# Patient Record
Sex: Female | Born: 1945
Health system: Southern US, Community
[De-identification: ages and names within clinical notes are randomized; demographics above are authoritative.]

## PROBLEM LIST (undated history)

## (undated) DIAGNOSIS — I10 Essential (primary) hypertension: Secondary | ICD-10-CM

## (undated) DIAGNOSIS — E119 Type 2 diabetes mellitus without complications: Secondary | ICD-10-CM

## (undated) DIAGNOSIS — Z8619 Personal history of other infectious and parasitic diseases: Secondary | ICD-10-CM

## (undated) HISTORY — PX: TUBAL LIGATION: SHX77

## (undated) HISTORY — PX: HEMORRHOID SURGERY: SHX153

## (undated) HISTORY — PX: CATARACT EXTRACTION, BILATERAL: SHX1313

## (undated) HISTORY — DX: Personal history of other infectious and parasitic diseases: Z86.19

---

## 2007-08-26 DIAGNOSIS — Z8619 Personal history of other infectious and parasitic diseases: Secondary | ICD-10-CM

## 2007-08-26 HISTORY — DX: Personal history of other infectious and parasitic diseases: Z86.19

## 2011-10-23 ENCOUNTER — Emergency Department (INDEPENDENT_AMBULATORY_CARE_PROVIDER_SITE_OTHER): Payer: 59

## 2011-10-23 ENCOUNTER — Emergency Department (HOSPITAL_BASED_OUTPATIENT_CLINIC_OR_DEPARTMENT_OTHER)
Admission: EM | Admit: 2011-10-23 | Discharge: 2011-10-23 | Disposition: A | Discharge status: Home / Self Care | Payer: 59 | Attending: Emergency Medicine | Admitting: Emergency Medicine

## 2011-10-23 ENCOUNTER — Encounter (HOSPITAL_BASED_OUTPATIENT_CLINIC_OR_DEPARTMENT_OTHER): Payer: Self-pay | Admitting: *Deleted

## 2011-10-23 DIAGNOSIS — I10 Essential (primary) hypertension: Secondary | ICD-10-CM | POA: Insufficient documentation

## 2011-10-23 DIAGNOSIS — M25569 Pain in unspecified knee: Secondary | ICD-10-CM

## 2011-10-23 DIAGNOSIS — R011 Cardiac murmur, unspecified: Secondary | ICD-10-CM | POA: Insufficient documentation

## 2011-10-23 DIAGNOSIS — M25561 Pain in right knee: Secondary | ICD-10-CM

## 2011-10-23 HISTORY — DX: Essential (primary) hypertension: I10

## 2011-10-23 MED ORDER — MELOXICAM 15 MG PO TABS: 15.0000 mg | ORAL_TABLET | Freq: Every day | ORAL | Status: DC

## 2011-10-23 NOTE — ED Notes (Signed)
Patient states she has had low back pain approximately 2 months ago.  Pain in her low back has resolved and now has progressively painful bilateral lower legs.  Pain is worse in the right leg and knee.  No known injury to her back.
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
## 2011-10-23 NOTE — Discharge Instructions (Signed)
I am giving you a prescription for Mobic.  It is a once per day medicine like Motrin.  Do not take both at the same time. I want you to take 1000mg  of Tylenol three times per day. You need to get a regular physician to see. I am giving you the number for [facility].  If you can't get in to see a regular physician in the next month or so, get in touch with them about your murmur.  RESOURCE GUIDE  Dental Problems  Patients with Medicaid: [facility] [phone] [address]                                           [phone] [address] Phone:  [phone]                                                  Phone:  [phone]  If unable to pay or uninsured, contact:  [facility] or [facility]. to become qualified for the adult dental clinic.  Chronic Pain Problems Contact [facility]  [phone] Patients need to be referred by their primary care doctor.  Insufficient Money for Medicine Contact [employer]:  call "211" or [facility] [phone].  No Primary Care Doctor Call [facility]  4167921826 Other agencies that provide inexpensive medical care    [facility]  [phone]    [facility]  [phone]    [facility]  [phone]    [facility]  [phone]    [facility]  [phone]    [facility]  [phone]  Psychological Services [facility] Behavioral Health  [phone] [facility]  [phone] [facility] Mental Health   [phone] (emergency services [phone])  Substance Abuse Resources [facility] and Drug Services  [phone] [facility] [phone] [facility] [place] [phone] Floydene Flock [phone] Residential & Outpatient Substance Abuse Program  7023586154  Abuse/Neglect [facility] Child Abuse Hotline 331-065-9075 [facility] Child Abuse Hotline 734-193-9338 (After Hours)  Emergency  Shelter [facility] 8597993062  Maternity Homes Room at the [place] [facility] [place] [phone] [facility] Services [phone]  MRSA Hotline #:   (478)458-5911    [facility] Resources  [facility]     [employer]                          [facility] Dept. [address] [place]                       [address]      [address]  Patrecia Pace  [facility] Phone:  [phone]                                   Phone:  [phone]                 Phone:  [place] Phone:  [place] [phone] (After Hours)

## 2011-10-23 NOTE — ED Provider Notes (Signed)
I saw and evaluated the patient, reviewed the resident's note and I agree with the findings and plan. Patient with knee pain consistent with osteoarthritis. She has no signs of effusion or septic joint on exam. Complex symptoms have been slowly worsening over the last several months. She denies any,. Plain film shows osseous demineralization. Also incidentally patient was found to have a heart murmur today. Patient is asymptomatic from this. She was given followup to cardiology, PCP, ortho  Gwyneth Sprout, MD 10/23/11 1220

## 2011-10-24 ENCOUNTER — Encounter: Payer: Self-pay | Admitting: Internal Medicine

## 2011-10-24 ENCOUNTER — Ambulatory Visit (INDEPENDENT_AMBULATORY_CARE_PROVIDER_SITE_OTHER): Payer: 59 | Admitting: Internal Medicine

## 2011-10-24 ENCOUNTER — Telehealth: Payer: Self-pay | Admitting: *Deleted

## 2011-10-24 DIAGNOSIS — I10 Essential (primary) hypertension: Secondary | ICD-10-CM

## 2011-10-24 DIAGNOSIS — R011 Cardiac murmur, unspecified: Secondary | ICD-10-CM

## 2011-10-24 DIAGNOSIS — Z79899 Other long term (current) drug therapy: Secondary | ICD-10-CM

## 2011-10-24 DIAGNOSIS — M25569 Pain in unspecified knee: Secondary | ICD-10-CM

## 2011-10-24 LAB — CBC WITH DIFFERENTIAL/PLATELET
Hemoglobin: 12.9 g/dL (ref 12.0–15.0)
Lymphocytes Relative: 42 % (ref 12–46)
Lymphs Abs: 2.9 10*3/uL (ref 0.7–4.0)
MCV: 82.2 fL (ref 78.0–100.0)
Monocytes Relative: 8 % (ref 3–12)
Neutrophils Relative %: 49 % (ref 43–77)
Platelets: 306 10*3/uL (ref 150–400)
RBC: 4.89 MIL/uL (ref 3.87–5.11)
WBC: 6.9 10*3/uL (ref 4.0–10.5)

## 2011-10-24 LAB — BASIC METABOLIC PANEL
CO2: 28 mEq/L (ref 19–32)
Chloride: 105 mEq/L (ref 96–112)
Potassium: 4.1 mEq/L (ref 3.5–5.3)
Sodium: 141 mEq/L (ref 135–145)

## 2011-10-24 MED ORDER — HYDROCHLOROTHIAZIDE 25 MG PO TABS: 25.0000 mg | ORAL_TABLET | Freq: Every day | ORAL | Status: DC

## 2011-10-24 NOTE — Patient Instructions (Signed)
We are in the process of scheduling your echocardiogram for evaluation of murmur

## 2011-10-24 NOTE — Telephone Encounter (Signed)
Patient called and left voice message stating she was seen today and [facility] she would like to change pharmacies to [facility] [address] [facility] [place] [facility].  Rx for HCTZ sent to [facility] [address]. Patient is aware.
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
## 2011-10-30 ENCOUNTER — Telehealth: Payer: Self-pay | Admitting: Internal Medicine

## 2011-10-30 NOTE — Telephone Encounter (Signed)
Received medical records from [facility]-Dr. Carlis Abbott

## 2011-11-01 ENCOUNTER — Encounter: Payer: Self-pay | Admitting: Internal Medicine

## 2011-11-01 DIAGNOSIS — I10 Essential (primary) hypertension: Secondary | ICD-10-CM | POA: Insufficient documentation

## 2011-11-01 DIAGNOSIS — M25569 Pain in unspecified knee: Secondary | ICD-10-CM | POA: Insufficient documentation

## 2011-11-01 DIAGNOSIS — R011 Cardiac murmur, unspecified: Secondary | ICD-10-CM | POA: Insufficient documentation

## 2011-11-01 NOTE — Assessment & Plan Note (Signed)
Asx. Obtain cbc, chem7, tsh. Begin hctz. Monitor bp as outpt and f/u in clinic as scheduled.

## 2011-11-01 NOTE — Assessment & Plan Note (Signed)
Asx. Schedule echo

## 2011-11-01 NOTE — Assessment & Plan Note (Signed)
Attempt mobic prn with food and no other nsaids. Consider ortho referral if sx's persist

## 2011-11-06 ENCOUNTER — Other Ambulatory Visit: Payer: Self-pay

## 2011-11-06 ENCOUNTER — Ambulatory Visit (HOSPITAL_COMMUNITY): Payer: 59 | Attending: Cardiology

## 2011-11-06 DIAGNOSIS — R011 Cardiac murmur, unspecified: Secondary | ICD-10-CM | POA: Insufficient documentation

## 2011-11-06 DIAGNOSIS — I10 Essential (primary) hypertension: Secondary | ICD-10-CM | POA: Insufficient documentation

## 2011-11-06 DIAGNOSIS — I059 Rheumatic mitral valve disease, unspecified: Secondary | ICD-10-CM | POA: Insufficient documentation

## 2011-11-06 DIAGNOSIS — I079 Rheumatic tricuspid valve disease, unspecified: Secondary | ICD-10-CM | POA: Insufficient documentation

## 2011-11-14 ENCOUNTER — Encounter: Payer: Self-pay | Admitting: Internal Medicine

## 2011-11-14 ENCOUNTER — Telehealth: Payer: Self-pay | Admitting: Internal Medicine

## 2011-11-14 ENCOUNTER — Ambulatory Visit (INDEPENDENT_AMBULATORY_CARE_PROVIDER_SITE_OTHER): Payer: 59 | Admitting: Internal Medicine

## 2011-11-14 VITALS — BP 142/90 | HR 92 | Temp 98.3°F | Resp 18 | Ht 65.0 in | Wt 186.0 lb

## 2011-11-14 DIAGNOSIS — R011 Cardiac murmur, unspecified: Secondary | ICD-10-CM

## 2011-11-14 DIAGNOSIS — I10 Essential (primary) hypertension: Secondary | ICD-10-CM

## 2011-11-14 DIAGNOSIS — M25569 Pain in unspecified knee: Secondary | ICD-10-CM

## 2011-11-14 DIAGNOSIS — Z79899 Other long term (current) drug therapy: Secondary | ICD-10-CM

## 2011-11-14 NOTE — Patient Instructions (Addendum)
Please schedule chem7-v58.69 and fasting lipid-401.9 prior to next visit

## 2011-11-14 NOTE — Telephone Encounter (Signed)
Please schedule chem7-v58.69 and fasting lipid-401.9 prior to next visit   Patient is coming in for follow up on 02/13/12.

## 2011-11-14 NOTE — Telephone Encounter (Signed)
Lab orders entered for June 2013. 

## 2011-11-16 NOTE — Assessment & Plan Note (Signed)
Improved

## 2011-11-16 NOTE — Assessment & Plan Note (Signed)
Echo shows mild concentric hypertrophy without focal abn. Suggestion of dynamic obstruction with mildly increased lvot. asx and no definitive criteria for hcm. Discussed with pt findings and option of cardiology evaluation vs observation with repeat echo. Wishes for the later.

## 2011-11-16 NOTE — Assessment & Plan Note (Signed)
Improving control. Continue current regimen. Obtain chem7 and lipid prior to next visit. Low sodium diet, exercise and wt loss.

## 2011-11-16 NOTE — Progress Notes (Signed)
  Subjective:    Patient ID: Kelly Griffin, female    DOB: [birth year], 66 y.o.   MRN: [id number]  HPI Pt presents to clinic for followup of multiple medical problems. BP now improving. Tolerating hctz without adverse effect. Right knee pain improved with only mild intermittent discomfort. Did not have to take the mobic. Reviewed glu 131 but was random. Reviewed echocardiogram obtained due to murmur. Denies cp, dyspnea, palpitations, dizziness, syncope or family h/o sudden death.   Past Medical History  Diagnosis Date  . Hypertension   . History of chicken pox     childhood  . History of shingles 2009   Past Surgical History  Procedure Date  . Tubal ligation   . Hemorrhoid surgery   . Cataract extraction, bilateral     reports that she has never smoked. She has never used smokeless tobacco. She reports that she does not drink alcohol or use illicit drugs. family history includes Heart disease in her maternal grandmother and Hypertension in her mother.  There is no history of Prostate cancer, and Breast cancer, and Colon cancer, and Diabetes, and Hyperlipidemia, . No Known Allergies    Review of Systems see hpi     Objective:   Physical Exam  Physical Exam  Nursing note and vitals reviewed. Constitutional: Appears well-developed and well-nourished. No distress.  HENT:  Head: Normocephalic and atraumatic.  Right Ear: External ear normal.  Left Ear: External ear normal.  Eyes: Conjunctivae are normal. No scleral icterus.  Neck: Neck supple. Carotid bruit is not present.  Cardiovascular: Normal rate, regular rhythm and normal heart sounds.  Exam reveals no gallop and no friction rub.   3/6 systolic murmur. Pulmonary/Chest: Effort normal and breath sounds normal. No respiratory distress. He has no wheezes. no rales.  Lymphadenopathy:    He has no cervical adenopathy.  Neurological:Alert.  Skin: Skin is warm and dry. Not diaphoretic.  Psychiatric: Has a normal mood and affect.          Assessment & Plan:

## 2012-02-13 ENCOUNTER — Ambulatory Visit: Payer: 59 | Admitting: Internal Medicine

## 2012-05-18 ENCOUNTER — Other Ambulatory Visit: Payer: Self-pay | Admitting: *Deleted

## 2012-05-18 MED ORDER — HYDROCHLOROTHIAZIDE 25 MG PO TABS: 25.0000 mg | ORAL_TABLET | Freq: Every day | ORAL | Status: DC

## 2012-05-18 NOTE — Telephone Encounter (Signed)
Rx to pharmacy--*PATIENT DUE FOR OFFICE VISIT*/SLS

## 2012-06-22 ENCOUNTER — Encounter: Payer: Self-pay | Admitting: Internal Medicine

## 2012-06-22 ENCOUNTER — Ambulatory Visit (INDEPENDENT_AMBULATORY_CARE_PROVIDER_SITE_OTHER): Payer: Self-pay | Admitting: Internal Medicine

## 2012-06-22 VITALS — BP 148/90 | HR 74 | Temp 98.3°F | Resp 16 | Wt 182.8 lb

## 2012-06-22 DIAGNOSIS — Z79899 Other long term (current) drug therapy: Secondary | ICD-10-CM

## 2012-06-22 DIAGNOSIS — R011 Cardiac murmur, unspecified: Secondary | ICD-10-CM

## 2012-06-22 DIAGNOSIS — I10 Essential (primary) hypertension: Secondary | ICD-10-CM

## 2012-06-22 LAB — BASIC METABOLIC PANEL
BUN: 14 mg/dL (ref 6–23)
CO2: 29 mEq/L (ref 19–32)
Chloride: 100 mEq/L (ref 96–112)
Glucose, Bld: 232 mg/dL — ABNORMAL HIGH (ref 70–99)
Potassium: 4 mEq/L (ref 3.5–5.3)

## 2012-06-22 MED ORDER — HYDROCHLOROTHIAZIDE 25 MG PO TABS: 25.0000 mg | ORAL_TABLET | Freq: Every day | ORAL | Status: DC

## 2012-06-23 ENCOUNTER — Encounter: Payer: Self-pay | Admitting: *Deleted

## 2012-06-23 NOTE — Assessment & Plan Note (Signed)
Declines followup echo. Readdress at next visit

## 2012-06-23 NOTE — Progress Notes (Signed)
  Subjective:    Patient ID: Kelly Griffin, female    DOB: [birth year], 66 y.o.   MRN: [id number]  HPI Pt presents to clinic for followup of multiple medical problems. Blood pressure reviewed elevated. Home monitoring has demonstrated systolic blood pressures in the 130 range and diastolic blood pressures in the upper TDs. Compliant with medication without adverse effect. Reviewed previous abnormal echocardiogram. Previously recommended followup echo for reevaluation. Currently defers because of cost considerations as she is currently self pay. Due for mammogram Pap smear and colonoscopy but defers these as well. Declines influenza vaccine.  Past Medical History  Diagnosis Date  . Hypertension   . History of chicken pox     childhood  . History of shingles 2009   Past Surgical History  Procedure Date  . Tubal ligation   . Hemorrhoid surgery   . Cataract extraction, bilateral     reports that she has never smoked. She has never used smokeless tobacco. She reports that she does not drink alcohol or use illicit drugs. family history includes Heart disease in her maternal grandmother and Hypertension in her mother.  There is no history of Prostate cancer, and Breast cancer, and Colon cancer, and Diabetes, and Hyperlipidemia, . No Known Allergies    Review of Systems see hpi     Objective:   Physical Exam  Physical Exam  Nursing note and vitals reviewed. Constitutional: Appears well-developed and well-nourished. No distress.  HENT:  Head: Normocephalic and atraumatic.  Right Ear: External ear normal.  Left Ear: External ear normal.  Eyes: Conjunctivae are normal. No scleral icterus.  Neck: Neck supple. Carotid bruit is not present.  Cardiovascular: Normal rate, regular rhythm and normal heart sounds.  Exam reveals no gallop and no friction rub.   3/6 systolic murmur best heard at the right sternal border Pulmonary/Chest: Effort normal and breath sounds normal. No respiratory  distress. He has no wheezes. no rales.  Lymphadenopathy:    He has no cervical adenopathy.  Neurological:Alert.  Skin: Skin is warm and dry. Not diaphoretic.  Psychiatric: Has a normal mood and affect.        Assessment & Plan:

## 2012-06-23 NOTE — Assessment & Plan Note (Signed)
Borderline control. Not interested in additional medication at this time. Recommend low-sodium diet, exercise and weight loss. Obtain Chem-7.

## 2012-06-30 ENCOUNTER — Telehealth: Payer: Self-pay | Admitting: *Deleted

## 2012-06-30 DIAGNOSIS — R739 Hyperglycemia, unspecified: Secondary | ICD-10-CM

## 2012-06-30 NOTE — Telephone Encounter (Signed)
Message copied by Regis Bill on Wed Jun 30, 2012  3:13 PM ------      Message from: Edwyna Perfect      Created: Tue Jun 22, 2012  9:14 PM       Random blood sugar 232-strongly suggestive of diabetes. Need to add a1c-hyperglycemia. If can't add then needs glucometer fsbs qam and f/u one week to review results

## 2012-06-30 NOTE — Telephone Encounter (Signed)
Patient informed; understood & agreed; will instruct pt how to use Glucometer [and keep fsbs log] when she is in office for A1C lab, future lab ordered/SLS

## 2012-07-05 LAB — HEMOGLOBIN A1C: Mean Plasma Glucose: 214 mg/dL — ABNORMAL HIGH (ref <117)

## 2012-07-05 NOTE — Addendum Note (Signed)
Addended by: Regis Bill on: 07/05/2012 10:58 AM   Modules accepted: Orders

## 2012-07-05 NOTE — Telephone Encounter (Signed)
Lab order released/SLS 

## 2012-07-07 ENCOUNTER — Telehealth: Payer: Self-pay | Admitting: *Deleted

## 2012-07-07 NOTE — Telephone Encounter (Signed)
LMOM with contact name & number RE: results & further provider instructions; new Rx pending/SLS

## 2012-07-07 NOTE — Telephone Encounter (Signed)
Message copied by Regis Bill on Wed Jul 07, 2012  2:57 PM ------      Message from: Edwyna Perfect      Created: Tue Jul 06, 2012  6:35 PM       She is definitely new diabetic. a1c 9.1= avg sugar 214 for 3 months. Begin metformin 500mg  bid #60 rf4. Low sugar/carb diet and daily exercise.  Check fsbs qam. Needs f/u appt in 2 weeks

## 2012-07-08 NOTE — Telephone Encounter (Signed)
LMOM [2nd] with contact name & number for return call RE: results & further provider instructions, LM that 2-wk follow up OV has been requested by provider; so pt may call scheduler to get this taken care of/SLS

## 2012-07-09 MED ORDER — METFORMIN HCL 500 MG PO TABS: 500.0000 mg | ORAL_TABLET | Freq: Two times a day (BID) | ORAL | Status: DC

## 2012-07-09 NOTE — Telephone Encounter (Signed)
Patient informed, understood & agreed; keeping fsbs log & will bring to f/u OV 11.27.13 @ 9:30am; new Rx to pharmacy/SLS

## 2012-07-14 ENCOUNTER — Telehealth: Payer: Self-pay | Admitting: *Deleted

## 2012-07-14 NOTE — Telephone Encounter (Signed)
Pt reports GI trouble, including diarrhea since starting Metformin; per VO TWH, LMOM with contact name & number to inform patient to decrease dosage form [500 mg BID] to only 500 mg Once daily until she returns to office for f/u OV 11.27.13/SLS

## 2012-07-21 ENCOUNTER — Encounter: Payer: Self-pay | Admitting: Internal Medicine

## 2012-07-21 ENCOUNTER — Ambulatory Visit (INDEPENDENT_AMBULATORY_CARE_PROVIDER_SITE_OTHER): Payer: Self-pay | Admitting: Internal Medicine

## 2012-07-21 VITALS — BP 152/98 | HR 84 | Temp 98.5°F | Resp 14 | Wt 176.5 lb

## 2012-07-21 DIAGNOSIS — I10 Essential (primary) hypertension: Secondary | ICD-10-CM

## 2012-07-21 DIAGNOSIS — E119 Type 2 diabetes mellitus without complications: Secondary | ICD-10-CM

## 2012-07-21 MED ORDER — BENAZEPRIL HCL 10 MG PO TABS: 10.0000 mg | ORAL_TABLET | Freq: Every day | ORAL | Status: DC

## 2012-07-21 NOTE — Progress Notes (Signed)
  Subjective:    Patient ID: Kelly Griffin, female    DOB: Jun 17, 1946, 66 y.o.   MRN: [id number]  HPI Pt presents to clinic for follow up of new dx of DM. Recent chem7 demonstrated random glucose above 200. Subsequent a1c 9.1. Began metformin bid but had to decrease to qd due to diarrhea. Was monitoring fsbs qd before starting metformin then test strips ran out. Pre medication fsbs 208, 201, 178, 158, 197, 191, 183, 176. S/p dietary changes and has lost ~6lbs. BP reviewed persistently high.   Past Medical History  Diagnosis Date  . Hypertension   . History of chicken pox     childhood  . History of shingles 2009   Past Surgical History  Procedure Date  . Tubal ligation   . Hemorrhoid surgery   . Cataract extraction, bilateral     reports that she has never smoked. She has never used smokeless tobacco. She reports that she does not drink alcohol or use illicit drugs. family history includes Heart disease in her maternal grandmother and Hypertension in her mother.  There is no history of Prostate cancer, and Breast cancer, and Colon cancer, and Diabetes, and Hyperlipidemia, . No Known Allergies   Review of Systems see hpi     Objective:   Physical Exam  Nursing note and vitals reviewed. Constitutional: She appears well-developed and well-nourished. No distress.  HENT:  Head: Normocephalic and atraumatic.  Neurological: She is alert.  Skin: She is not diaphoretic.  Psychiatric: She has a normal mood and affect.          Assessment & Plan:

## 2012-07-21 NOTE — Assessment & Plan Note (Signed)
Savings card and prescription for test strips provided (card indicates may be sometimes authorized if self pay). Resume fsbs qd and call clinic with results after ~2 weeks. Continue dietary changes and begin regular exercise. Discussed diabetic teaching referral, diabetic eye exam referral, lipid testing recommended but pt requests to defer until obtains insurance. Schedule 6 wk f/u.

## 2012-07-21 NOTE — Assessment & Plan Note (Signed)
Begin ace inhibitor. Monitor bp as outpt and f/u in clinic as scheduled.

## 2012-09-01 ENCOUNTER — Encounter: Payer: Self-pay | Admitting: Internal Medicine

## 2012-09-01 ENCOUNTER — Ambulatory Visit (INDEPENDENT_AMBULATORY_CARE_PROVIDER_SITE_OTHER): Payer: Self-pay | Admitting: Internal Medicine

## 2012-09-01 ENCOUNTER — Telehealth: Payer: Self-pay | Admitting: Internal Medicine

## 2012-09-01 VITALS — BP 130/86 | HR 78 | Temp 98.2°F | Resp 16 | Wt 167.2 lb

## 2012-09-01 DIAGNOSIS — E119 Type 2 diabetes mellitus without complications: Secondary | ICD-10-CM

## 2012-09-01 NOTE — Telephone Encounter (Signed)
Please schedule fasting labs prior to next visit- chem7, a1c, lipid, urine microalbumin-250.00  Patient has upcoming appointment on 12/23/12. She will be going to [facility] lab

## 2012-09-01 NOTE — Patient Instructions (Signed)
Please schedule fasting labs prior to next visit- chem7, a1c, lipid, urine microalbumin-250.00

## 2012-09-01 NOTE — Progress Notes (Signed)
  Subjective:    Patient ID: Kelly Griffin, female    DOB: 14-Jun-1946, 67 y.o.   MRN: [id number]  HPI Pt presents to clinic for followup of multiple medical problems. Recently dx'ed with DM. Tolerating metformin without adverse effect. Reviewed fsbs log with predominance of values 90's-120's without hypoglycemia. BP improved. Plans to get flu vaccine at outside pharmacy.  Past Medical History  Diagnosis Date  . Hypertension   . History of chicken pox     childhood  . History of shingles 2009   Past Surgical History  Procedure Date  . Tubal ligation   . Hemorrhoid surgery   . Cataract extraction, bilateral     reports that she has never smoked. She has never used smokeless tobacco. She reports that she does not drink alcohol or use illicit drugs. family history includes Heart disease in her maternal grandmother and Hypertension in her mother.  There is no history of Prostate cancer, and Breast cancer, and Colon cancer, and Diabetes, and Hyperlipidemia, . No Known Allergies    Review of Systems see hpi      Objective:   Physical Exam  Nursing note and vitals reviewed. Constitutional: She appears well-developed and well-nourished. No distress.  Skin: She is not diaphoretic.          Assessment & Plan:

## 2012-09-05 NOTE — Assessment & Plan Note (Signed)
Good control and significant improvement. Understands recommedation for diabetic eye exam and diabetic teaching but defers until receives insurance.

## 2012-12-17 ENCOUNTER — Ambulatory Visit: Payer: Self-pay | Admitting: Internal Medicine

## 2012-12-23 ENCOUNTER — Ambulatory Visit: Payer: Self-pay | Admitting: Internal Medicine

## 2012-12-24 ENCOUNTER — Encounter: Payer: Self-pay | Admitting: Family

## 2012-12-24 ENCOUNTER — Ambulatory Visit (INDEPENDENT_AMBULATORY_CARE_PROVIDER_SITE_OTHER): Payer: Medicare Other | Admitting: Family

## 2012-12-24 VITALS — BP 114/80 | HR 89 | Temp 98.3°F | Resp 16 | Ht 65.5 in | Wt 159.1 lb

## 2012-12-24 DIAGNOSIS — I10 Essential (primary) hypertension: Secondary | ICD-10-CM

## 2012-12-24 DIAGNOSIS — R634 Abnormal weight loss: Secondary | ICD-10-CM

## 2012-12-24 DIAGNOSIS — E119 Type 2 diabetes mellitus without complications: Secondary | ICD-10-CM

## 2012-12-24 DIAGNOSIS — IMO0001 Reserved for inherently not codable concepts without codable children: Secondary | ICD-10-CM

## 2012-12-24 DIAGNOSIS — Z23 Encounter for immunization: Secondary | ICD-10-CM

## 2012-12-24 DIAGNOSIS — E785 Hyperlipidemia, unspecified: Secondary | ICD-10-CM

## 2012-12-24 LAB — BASIC METABOLIC PANEL WITH GFR
BUN: 13 mg/dL (ref 6–23)
CO2: 30 mEq/L (ref 19–32)
Chloride: 102 mEq/L (ref 96–112)
GFR, Est Non African American: 71 mL/min
Glucose, Bld: 132 mg/dL — ABNORMAL HIGH (ref 70–99)
Potassium: 3.9 mEq/L (ref 3.5–5.3)

## 2012-12-24 LAB — LIPID PANEL
LDL Cholesterol: 156 mg/dL — ABNORMAL HIGH (ref 0–99)
Triglycerides: 93 mg/dL (ref <150)

## 2012-12-24 LAB — HEMOGLOBIN A1C: Hgb A1c MFr Bld: 6.1 % — ABNORMAL HIGH (ref <5.7)

## 2012-12-24 MED ORDER — BENAZEPRIL HCL 10 MG PO TABS: 10.0000 mg | ORAL_TABLET | Freq: Every day | ORAL | Status: DC

## 2012-12-24 MED ORDER — HYDROCHLOROTHIAZIDE 25 MG PO TABS: 25.0000 mg | ORAL_TABLET | Freq: Every day | ORAL | Status: DC

## 2012-12-24 MED ORDER — METFORMIN HCL ER 500 MG PO TB24: 500.0000 mg | ORAL_TABLET | Freq: Every day | ORAL | Status: DC

## 2012-12-24 NOTE — Patient Instructions (Addendum)
Please complete lab work prior to leaving. You will be contacted about your referral to opthalmology. Please let us know if you have not heard back within 1 week about your referral.

## 2012-12-24 NOTE — Assessment & Plan Note (Signed)
BP Readings from Last 3 Encounters:  12/24/12 114/80  09/01/12 130/86  07/21/12 152/98    BP looks good. Continue current meds. Obtain bmet.

## 2012-12-24 NOTE — Addendum Note (Signed)
Addended by: Sandford Craze on: 12/24/2012 04:52 PM   Modules accepted: Orders

## 2012-12-24 NOTE — Progress Notes (Signed)
Subjective:    Patient ID: Kelly Griffin, female    DOB: 1946/07/29, 67 y.o.   MRN: [id number]  HPI  Kelly Griffin is a 67 yr old female who presents today for follow up.  1) HTN- currently maintained on benazepril and hctz.  2) DM2-  Last A1C was 11/13 and was 9.1. She is maintained on metformin only. She reports that her sugars are running just over 100 which is improved  3) weight loss- Reports that she has been working on a diabetic diet.  Eating smaller portions, less carbs and has exercised more. Has lost 30 pounds in the last year.   Review of Systems See HPI  Past Medical History  Diagnosis Date  . Hypertension   . History of chicken pox     childhood  . History of shingles 2009    History   Social History  . Marital Status: Married    Spouse Name: N/A    Number of Children: N/A  . Years of Education: N/A   Occupational History  . Not on file.   Social History Main Topics  . Smoking status: Never Smoker   . Smokeless tobacco: Never Used  . Alcohol Use: No  . Drug Use: No  . Sexually Active: Not on file   Other Topics Concern  . Not on file   Social History Narrative  . No narrative on file    Past Surgical History  Procedure Laterality Date  . Tubal ligation    . Hemorrhoid surgery    . Cataract extraction, bilateral      Family History  Problem Relation Age of Onset  . Prostate cancer Neg Hx   . Breast cancer Neg Hx   . Colon cancer Neg Hx   . Heart disease Maternal Grandmother   . Diabetes Neg Hx   . Hypertension Mother   . Hyperlipidemia Neg Hx     No Known Allergies  Current Outpatient Prescriptions on File Prior to Visit  Medication Sig Dispense Refill  . aspirin 325 MG EC tablet Take 325 mg by mouth daily as needed.       . Blood Glucose Monitoring Suppl (FREESTYLE INSULINX SYSTEM) W/DEVICE KIT by Does not apply route every morning.      Marland Kitchen glucose blood (FREESTYLE INSULINX TEST) test strip 1 each by Other route as needed. Use as  instructed      . Lancets (FREESTYLE) lancets 1 each by Other route as needed. Use as instructed      . multivitamin (THERAGRAN) per tablet Take 1 tablet by mouth daily.       No current facility-administered medications on file prior to visit.    BP 114/80  Pulse 89  Temp(Src) 98.3 F (36.8 C) (Oral)  Resp 16  Ht 5' 5.5" (1.664 m)  Wt 159 lb 1.3 oz (72.158 kg)  BMI 26.06 kg/m2  SpO2 99%       Objective:   Physical Exam  Constitutional: She is oriented to person, place, and time. She appears well-developed and well-nourished. No distress.  HENT:  Head: Normocephalic and atraumatic.  Cardiovascular: Normal rate and regular rhythm.   No murmur heard. Pulmonary/Chest: Effort normal and breath sounds normal. No respiratory distress. She has no wheezes. She has no rales. She exhibits no tenderness.  Musculoskeletal: She exhibits no edema.  Neurological: She is alert and oriented to person, place, and time.  Psychiatric: She has a normal mood and affect. Her behavior is normal. Judgment and  thought content normal.          Assessment & Plan:

## 2012-12-24 NOTE — Assessment & Plan Note (Addendum)
She was unable to tolerate the bid  Metformin, only taking 500mg  in the AM.  Will try switching to the extended version of metformin.  I commended her on her weight loss and dietary changes.  Will check TSH.  Goal weight for her will be about 150.  I have asked her to let me know if her weight is <150. Pneumovax today, refer for diabetic eye exam.

## 2012-12-25 LAB — MICROALBUMIN / CREATININE URINE RATIO: Microalb Creat Ratio: 2.9 mg/g (ref 0.0–30.0)

## 2012-12-28 ENCOUNTER — Telehealth: Payer: Self-pay | Admitting: Family

## 2012-12-28 NOTE — Telephone Encounter (Signed)
Opened in error

## 2012-12-30 ENCOUNTER — Telehealth: Payer: Self-pay | Admitting: Family

## 2012-12-30 NOTE — Telephone Encounter (Signed)
Please call husband and let him know that pt's cholesterol is elevated.  I would like them to work hard on low cholesterol diet.  We will plan to repeat fasting cholesterol and liver tests in August.

## 2012-12-30 NOTE — Telephone Encounter (Signed)
Message copied by Sandford Craze on Thu Dec 30, 2012  9:29 PM ------      Message from: Mervin Kung A      Created: Wed Dec 29, 2012  3:49 PM       I did not receive a note on this pt.                  ----- Message -----         From: Sandford Craze, NP         Sent: 12/28/2012   9:28 PM           To: Kathi Simpers, CMA            Did I send you a phone note on her?  Wanted to add info re: cholesterol- please route back to me if you have it. Thanks!       ------

## 2012-12-31 NOTE — Telephone Encounter (Signed)
Patient notified of lab results

## 2013-03-07 ENCOUNTER — Telehealth: Payer: Self-pay | Admitting: *Deleted

## 2013-03-07 MED ORDER — FREESTYLE LANCETS MISC: Status: DC

## 2013-03-07 MED ORDER — GLUCOSE BLOOD VI STRP: ORAL_STRIP | Status: DC

## 2013-03-07 NOTE — Telephone Encounter (Signed)
Received message from pt on 03/03/13 requesting refill of test strips and lancets and she now has Medicare. Refills sent to pharmacy.

## 2013-03-08 MED ORDER — FREESTYLE LANCETS MISC: Status: AC

## 2013-03-08 MED ORDER — GLUCOSE BLOOD VI STRP: ORAL_STRIP | Status: DC

## 2013-03-08 NOTE — Telephone Encounter (Signed)
eRx transmission failed to pharmacy. Rxs faxed.

## 2013-03-08 NOTE — Addendum Note (Signed)
Addended by: Mervin Kung A on: 03/08/2013 01:24 PM   Modules accepted: Orders

## 2013-03-08 NOTE — Telephone Encounter (Signed)
Electronic transmission failed. Printed rxs and faxed them to

## 2013-03-08 NOTE — Telephone Encounter (Signed)
Faxed rxs to [phone] as pharmacist stated pt's insurance would require a hard script with dx codes.

## 2013-03-08 NOTE — Telephone Encounter (Signed)
Left message for pt to return my call re: completion.

## 2013-03-09 NOTE — Telephone Encounter (Signed)
Pt left message requesting status of refill. Attempted to reach pt and left message on home# that refills were completed and to call if further questions.

## 2013-03-30 ENCOUNTER — Encounter: Payer: Self-pay | Admitting: Family

## 2013-03-30 ENCOUNTER — Ambulatory Visit (INDEPENDENT_AMBULATORY_CARE_PROVIDER_SITE_OTHER): Payer: MEDICARE | Admitting: Family

## 2013-03-30 VITALS — BP 130/80 | HR 86 | Temp 98.0°F | Resp 16 | Ht 65.5 in | Wt 156.0 lb

## 2013-03-30 DIAGNOSIS — L659 Nonscarring hair loss, unspecified: Secondary | ICD-10-CM

## 2013-03-30 DIAGNOSIS — E119 Type 2 diabetes mellitus without complications: Secondary | ICD-10-CM

## 2013-03-30 DIAGNOSIS — E785 Hyperlipidemia, unspecified: Secondary | ICD-10-CM

## 2013-03-30 DIAGNOSIS — I10 Essential (primary) hypertension: Secondary | ICD-10-CM

## 2013-03-30 LAB — LIPID PANEL
Cholesterol: 244 mg/dL — ABNORMAL HIGH (ref 0–200)
Triglycerides: 78 mg/dL (ref <150)
VLDL: 16 mg/dL (ref 0–40)

## 2013-03-30 NOTE — Patient Instructions (Addendum)
Please complete your lab work prior to leaving.  Please schedule a wellness visit in 3 months.

## 2013-03-30 NOTE — Assessment & Plan Note (Signed)
Seems to be more of a breakage issue than loss.  Suspect secondary to chemical processing. We discussed minimizing chemical processing/heat processing. OK to try vitamin supplement for hair.

## 2013-03-30 NOTE — Progress Notes (Signed)
Subjective:    Patient ID: Kelly Griffin, female    DOB: August 20, 1946, 67 y.o.   MRN: [id number]  HPI  Kelly Griffin is a 67 yr old female who presents today for follow up.  1) HTN- currently maintained on benazepril, hctz.  She denies CP/SOB/Swelling  2) DM2- last visit she was placed on an extended release metformin. She reports that she is tolerating this better. A1C was 6.1.  She is up to date on her pneumovax.  She is on an ACE and an aspirin.  She reports that her sugars at home 112-115.  Never above 125.  She reports that her last eye exam was 3 years ago. She is scheduled for follow up with Dr. Hazle Quant.   3) Hair loss- reports recent increase in hair shedding and hair breakage.  She would like to start a hair/skin/nails supplement.  She does straighten and color her hair.  4) Hyperlipidemia-  Last visit LDL was noted to be 156.   Review of Systems See HPI  Past Medical History  Diagnosis Date  . Hypertension   . History of chicken pox     childhood  . History of shingles 2009    History   Social History  . Marital Status: Married    Spouse Name: N/A    Number of Children: N/A  . Years of Education: N/A   Occupational History  . Not on file.   Social History Main Topics  . Smoking status: Never Smoker   . Smokeless tobacco: Never Used  . Alcohol Use: No  . Drug Use: No  . Sexually Active: Not on file   Other Topics Concern  . Not on file   Social History Narrative  . No narrative on file    Past Surgical History  Procedure Laterality Date  . Tubal ligation    . Hemorrhoid surgery    . Cataract extraction, bilateral      Family History  Problem Relation Age of Onset  . Prostate cancer Neg Hx   . Breast cancer Neg Hx   . Colon cancer Neg Hx   . Heart disease Maternal Grandmother   . Diabetes Neg Hx   . Hypertension Mother   . Hyperlipidemia Neg Hx     No Known Allergies  Current Outpatient Prescriptions on File Prior to Visit  Medication Sig  Dispense Refill  . aspirin 325 MG EC tablet Take 325 mg by mouth daily as needed.       . benazepril (LOTENSIN) 10 MG tablet Take 1 tablet (10 mg total) by mouth daily.  30 tablet  3  . Blood Glucose Monitoring Suppl (FREESTYLE INSULINX SYSTEM) W/DEVICE KIT by Does not apply route every morning.      Marland Kitchen glucose blood (FREESTYLE INSULINX TEST) test strip Use to test blood sugar once daily as instructed.  Dx 250.00  100 each  1  . hydrochlorothiazide (HYDRODIURIL) 25 MG tablet Take 1 tablet (25 mg total) by mouth daily.  30 tablet  3  . hydrochlorothiazide (HYDRODIURIL) 25 MG tablet Take 1 tablet (25 mg total) by mouth daily.  30 tablet  3  . Lancets (FREESTYLE) lancets Use to test blood sugar once daily as instructed.  Dx 250.00.  100 each  1  . metFORMIN (GLUCOPHAGE-XR) 500 MG 24 hr tablet Take 1 tablet (500 mg total) by mouth daily with breakfast.  30 tablet  3  . multivitamin (THERAGRAN) per tablet Take 1 tablet by mouth daily.  No current facility-administered medications on file prior to visit.    BP 130/80  Pulse 86  Temp(Src) 98 F (36.7 C) (Oral)  Resp 16  Ht 5' 5.5" (1.664 m)  Wt 156 lb 0.6 oz (70.779 kg)  BMI 25.56 kg/m2  SpO2 96%       Objective:   Physical Exam  Constitutional: She is oriented to person, place, and time. She appears well-developed and well-nourished. No distress.  HENT:  Head: Normocephalic and atraumatic.  Cardiovascular: Normal rate and regular rhythm.   No murmur heard. Pulmonary/Chest: Effort normal and breath sounds normal. No respiratory distress. She has no wheezes. She has no rales. She exhibits no tenderness.  Musculoskeletal: She exhibits no edema.  Neurological: She is alert and oriented to person, place, and time.  Psychiatric: She has a normal mood and affect. Her behavior is normal. Judgment and thought content normal.          Assessment & Plan:

## 2013-03-30 NOTE — Assessment & Plan Note (Signed)
Obtain A1C, continue glucophage xr.

## 2013-03-30 NOTE — Assessment & Plan Note (Signed)
·   Obtain FLP

## 2013-03-30 NOTE — Assessment & Plan Note (Signed)
BP Readings from Last 3 Encounters:  03/30/13 130/80  12/24/12 114/80  09/01/12 130/86   BP stable on current meds. Continues same.

## 2013-04-01 ENCOUNTER — Telehealth: Payer: Self-pay | Admitting: Family

## 2013-04-01 DIAGNOSIS — E785 Hyperlipidemia, unspecified: Secondary | ICD-10-CM

## 2013-04-01 NOTE — Telephone Encounter (Signed)
Please call pt and and let her know that her cholesterol remains elevated.  Due to hx of diabetes (which appears well controlled) I would like her to start a statin.  rx pended for simvastatin. We do not have baseline LFT's for her though, can we add on to labs drawn? If not, she should return to lab for lft prior to starting statin.  After she starts statin she should call if unusual muscle pain occurs after starting this medication. Repeat LFT/FLP in 6 weeks. Dx hyperlipidemia.

## 2013-04-03 MED ORDER — SIMVASTATIN 20 MG PO TABS: 20.0000 mg | ORAL_TABLET | Freq: Every day | ORAL | Status: AC

## 2013-04-06 NOTE — Telephone Encounter (Signed)
Notified pt of instructions below and she voices understanding. Unable to add LFT to labs from 03/30/13. Pt will return to the lab within the week for LFTs and is aware not to start simvastatin until after she has labs drawn. Future orders also placed for lft/flp in 6 weeks. Pt voices understanding. Pt states that she would like 90 day supply of her medications in the future. Just received last refill of medications and will contact pharmacy when refills are due again.

## 2013-04-06 NOTE — Addendum Note (Signed)
Addended by: Mervin Kung A on: 04/06/2013 11:28 AM   Modules accepted: Orders

## 2013-05-02 ENCOUNTER — Other Ambulatory Visit: Payer: Self-pay | Admitting: Family

## 2013-05-02 IMAGING — CR DG KNEE COMPLETE 4+V*R*
4 series · 4 of 4 positions shown · non-contrast
Comparison: None

CLINICAL DATA: Right knee pain and tenderness for 2 months

RIGHT KNEE - COMPLETE 4+ VIEW

[t knee ap right]
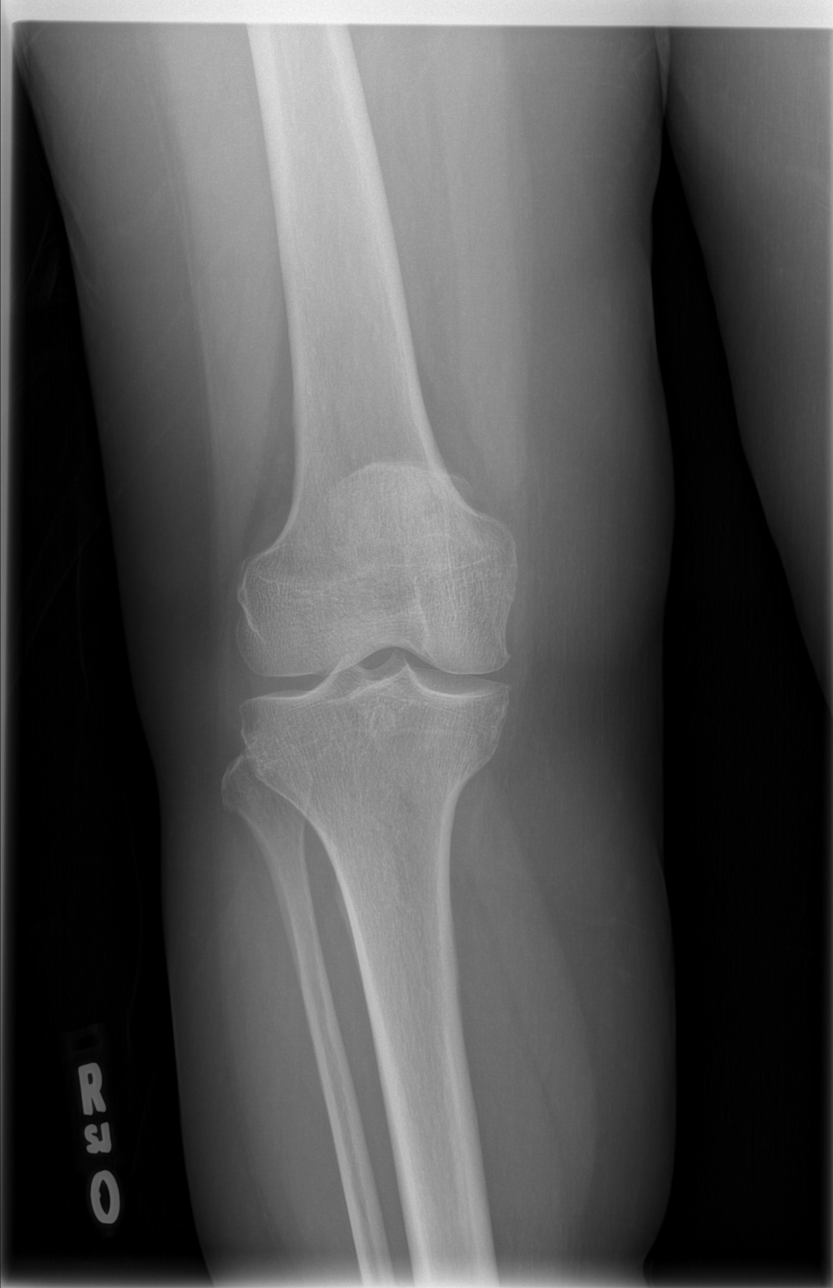

[t knee oblique right (1 of 2)]
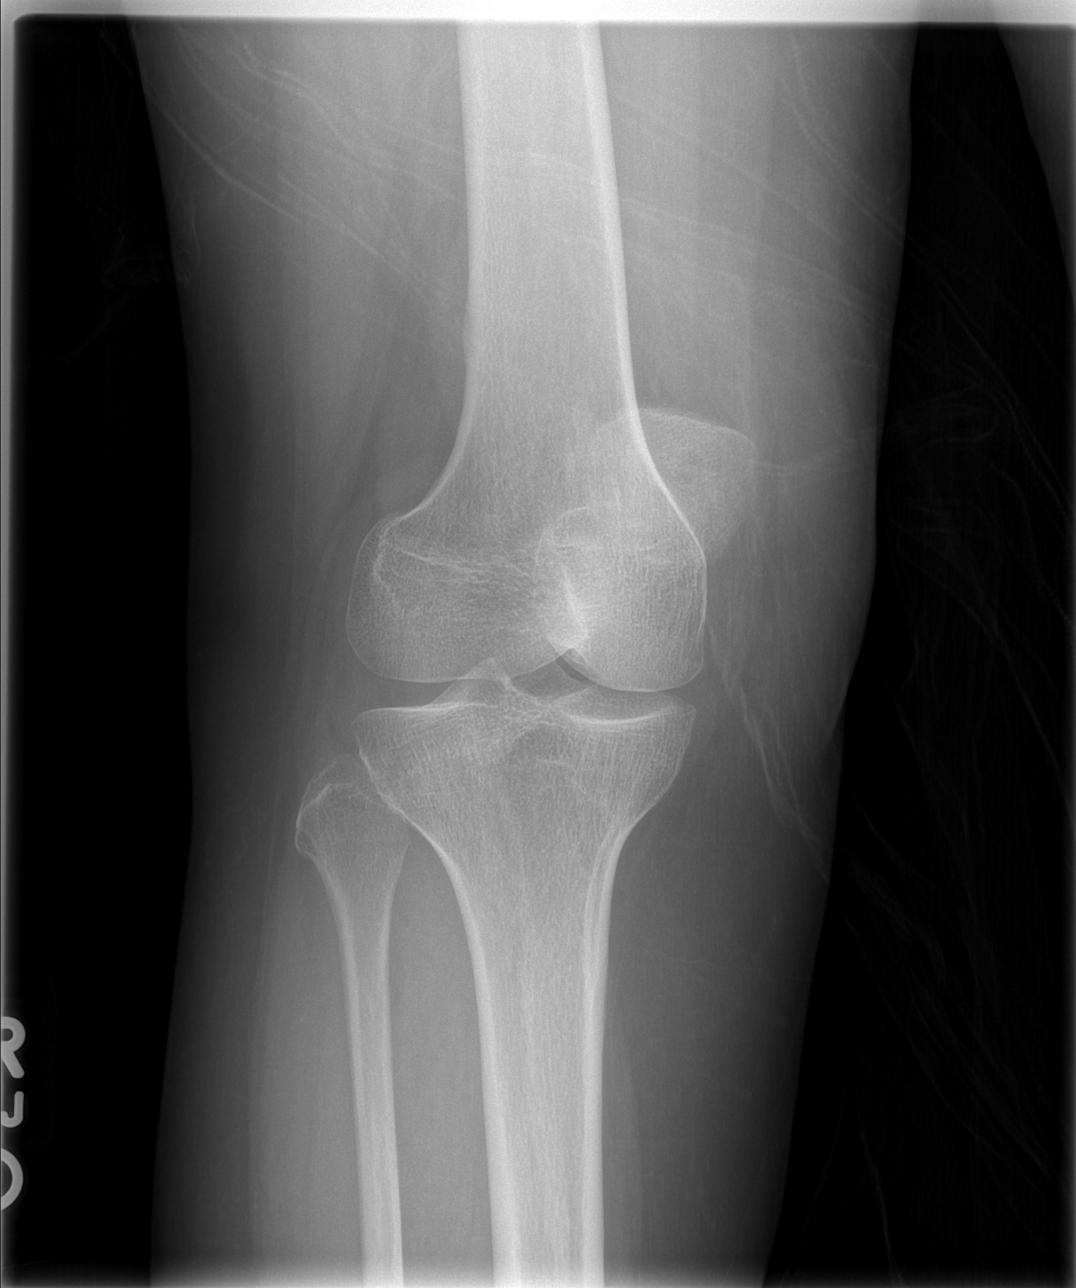

[t knee oblique right (2 of 2)]
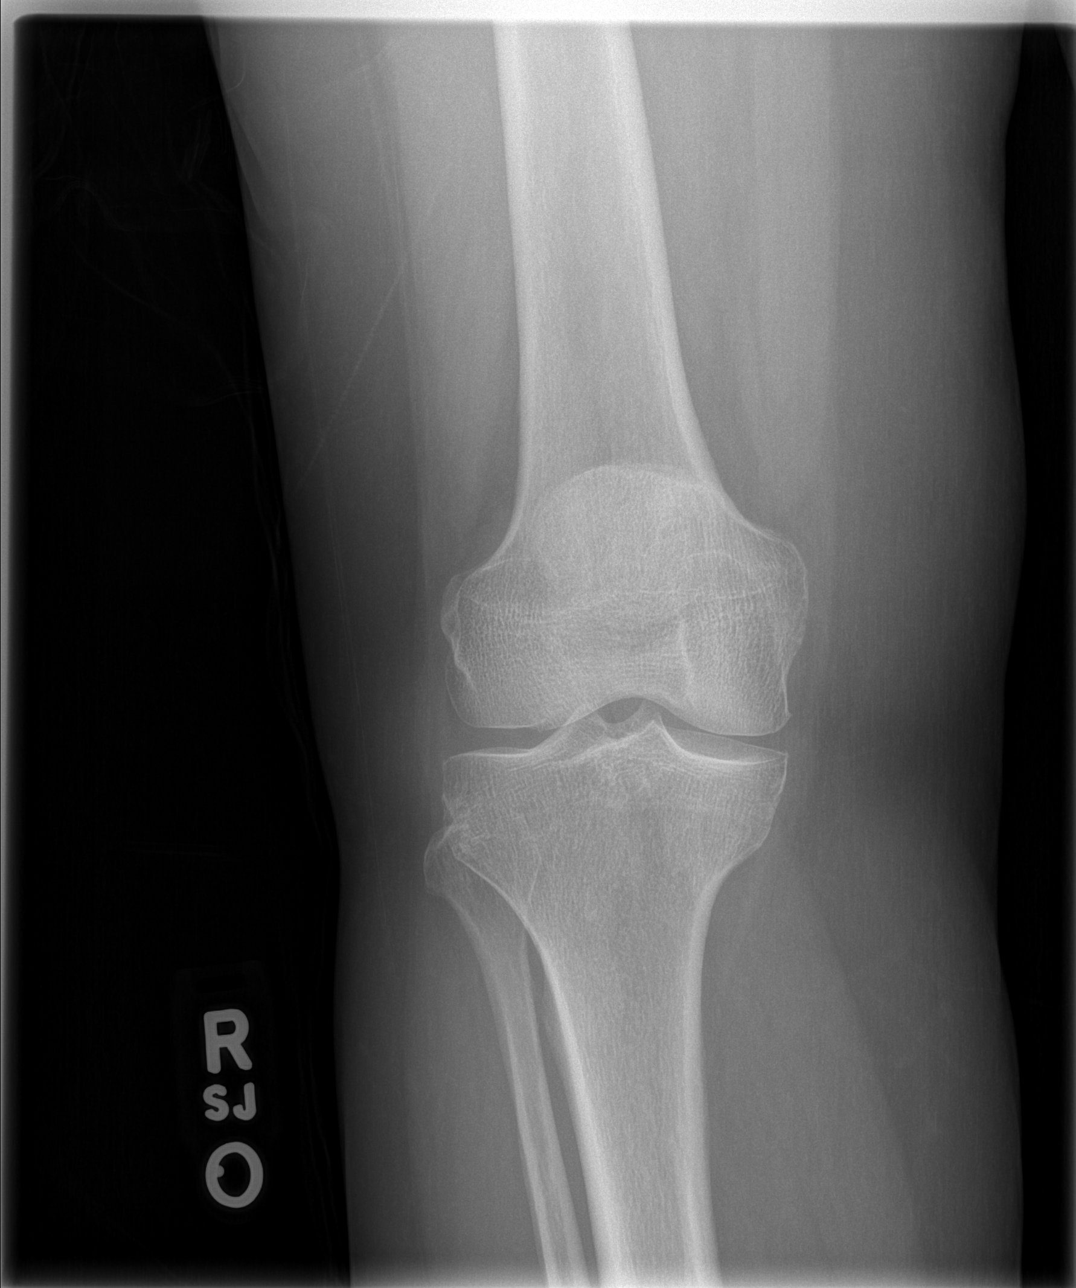

[t knee lat right]
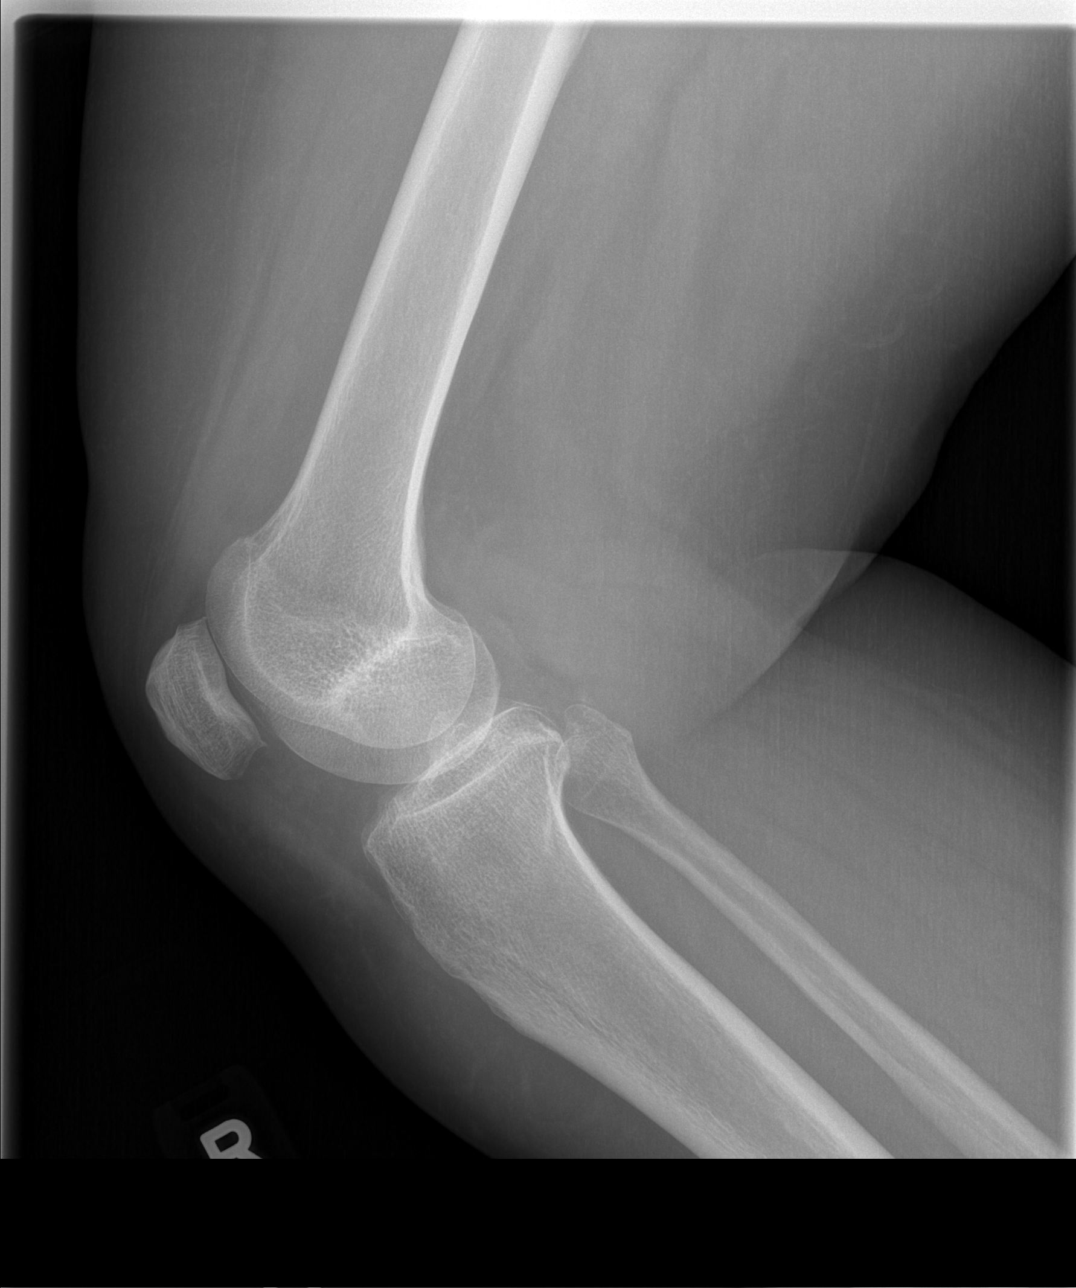

[4 of 4 positions shown; findings below may reference images not displayed]

FINDINGS: Osseous demineralization.
Joint spaces preserved.
No acute fracture, dislocation or bone destruction.
No knee joint effusion or regional soft tissue abnormality.
IMPRESSION: Osseous demineralization.
No acute abnormalities.

## 2013-05-02 NOTE — Telephone Encounter (Signed)
Rx request to pharmacy/SLS  

## 2013-05-06 ENCOUNTER — Encounter: Payer: Self-pay | Admitting: Family

## 2013-06-03 ENCOUNTER — Telehealth: Payer: Self-pay | Admitting: *Deleted

## 2013-06-03 MED ORDER — GLUCOSE BLOOD VI STRP: ORAL_STRIP | Status: AC

## 2013-06-03 NOTE — Telephone Encounter (Signed)
Refill printed and was faxed to pharmacy.

## 2013-06-03 NOTE — Telephone Encounter (Signed)
Received message from pt requesting refill of glucometer test strips. Refill sent. Left message on home # for pt to return my call.

## 2013-06-28 ENCOUNTER — Ambulatory Visit: Payer: MEDICARE | Admitting: Family

## 2015-07-19 ENCOUNTER — Encounter (HOSPITAL_BASED_OUTPATIENT_CLINIC_OR_DEPARTMENT_OTHER): Payer: Self-pay | Admitting: *Deleted

## 2015-07-19 ENCOUNTER — Emergency Department (HOSPITAL_BASED_OUTPATIENT_CLINIC_OR_DEPARTMENT_OTHER)
Admission: EM | Admit: 2015-07-19 | Discharge: 2015-07-19 | Disposition: A | Discharge status: Home / Self Care | Payer: MEDICARE | Attending: Emergency Medicine | Admitting: Emergency Medicine

## 2015-07-19 DIAGNOSIS — Z7984 Long term (current) use of oral hypoglycemic drugs: Secondary | ICD-10-CM | POA: Diagnosis not present

## 2015-07-19 DIAGNOSIS — Z7982 Long term (current) use of aspirin: Secondary | ICD-10-CM | POA: Insufficient documentation

## 2015-07-19 DIAGNOSIS — Z79899 Other long term (current) drug therapy: Secondary | ICD-10-CM | POA: Insufficient documentation

## 2015-07-19 DIAGNOSIS — E119 Type 2 diabetes mellitus without complications: Secondary | ICD-10-CM | POA: Insufficient documentation

## 2015-07-19 DIAGNOSIS — I10 Essential (primary) hypertension: Secondary | ICD-10-CM | POA: Diagnosis not present

## 2015-07-19 DIAGNOSIS — E7800 Pure hypercholesterolemia, unspecified: Secondary | ICD-10-CM | POA: Diagnosis not present

## 2015-07-19 DIAGNOSIS — Z8619 Personal history of other infectious and parasitic diseases: Secondary | ICD-10-CM | POA: Diagnosis not present

## 2015-07-19 DIAGNOSIS — R42 Dizziness and giddiness: Secondary | ICD-10-CM | POA: Diagnosis not present

## 2015-07-19 DIAGNOSIS — R55 Syncope and collapse: Secondary | ICD-10-CM | POA: Insufficient documentation

## 2015-07-19 HISTORY — DX: Type 2 diabetes mellitus without complications: E11.9

## 2015-07-19 LAB — URINE MICROSCOPIC-ADD ON

## 2015-07-19 LAB — BASIC METABOLIC PANEL
ANION GAP: 9 (ref 5–15)
BUN: 19 mg/dL (ref 6–20)
CHLORIDE: 102 mmol/L (ref 101–111)
CO2: 25 mmol/L (ref 22–32)
CREATININE: 0.82 mg/dL (ref 0.44–1.00)
Calcium: 9.5 mg/dL (ref 8.9–10.3)
GFR calc non Af Amer: >60 mL/min (ref >60)
Glucose, Bld: 109 mg/dL — ABNORMAL HIGH (ref 65–99)
POTASSIUM: 3.6 mmol/L (ref 3.5–5.1)
SODIUM: 136 mmol/L (ref 135–145)

## 2015-07-19 LAB — CBC
HCT: 35.4 % — ABNORMAL LOW (ref 36.0–46.0)
HEMOGLOBIN: 11.4 g/dL — AB (ref 12.0–15.0)
MCH: 26.1 pg (ref 26.0–34.0)
MCHC: 32.2 g/dL (ref 30.0–36.0)
MCV: 81 fL (ref 78.0–100.0)
PLATELETS: 292 10*3/uL (ref 150–400)
RBC: 4.37 MIL/uL (ref 3.87–5.11)
RDW: 13.5 % (ref 11.5–15.5)
WBC: 8.5 10*3/uL (ref 4.0–10.5)

## 2015-07-19 LAB — URINALYSIS, ROUTINE W REFLEX MICROSCOPIC
Bilirubin Urine: NEGATIVE
GLUCOSE, UA: NEGATIVE mg/dL
Hgb urine dipstick: NEGATIVE
Ketones, ur: NEGATIVE mg/dL
Nitrite: NEGATIVE
PROTEIN: NEGATIVE mg/dL
Specific Gravity, Urine: 1.004 — ABNORMAL LOW (ref 1.005–1.030)
pH: 6 (ref 5.0–8.0)

## 2015-07-19 LAB — CBG MONITORING, ED: GLUCOSE-CAPILLARY: 92 mg/dL (ref 65–99)

## 2015-07-19 NOTE — ED Notes (Signed)
This am she got weak while standing in the kitchen. Her husband assisted her to a chair and she passed out. EMS was called and she was fine by that time and refused transport. She is alert and feels fine on arrival to the ED.

## 2015-07-19 NOTE — ED Notes (Signed)
Family at bedside. 

## 2015-07-19 NOTE — ED Notes (Signed)
amb to BR w/o difficulty 

## 2015-07-19 NOTE — ED Provider Notes (Signed)
CSN: [id number]     Arrival date & time 07/19/15  1254 History   First MD Initiated Contact with Patient 07/19/15 1308     Chief Complaint  Patient presents with  . Loss of Consciousness     (Consider location/radiation/quality/duration/timing/severity/associated sxs/prior Treatment) HPI Comments: Patient with history of hypertension, diabetes, high cholesterol controlled on medications -- presents with syncopal episode. Patient was standing working in the kitchen when she began to feel lightheaded. She felt this way for approximately 1-2 minutes. She called for her husband to help her into a chair and when her husband arrived she slumped backwards and was lowered carefully to the floor by her husband. Husband reports that she was minimally conscious for approximately 30 seconds before coming back to her baseline. Patient denies having any headache, chest pain, shortness of breath or other symptoms during this episode. She has had lightheadedness in the past but no syncope. No recent bleeding. No recent illness, fevers, vomiting or diarrhea. Patient is back to her baseline and feels well. No h/o arrhythmia.   Patient is a 69 y.o. female presenting with syncope. The history is provided by the patient and the spouse.  Loss of Consciousness Associated symptoms: no chest pain, no fever, no headaches, no nausea, no seizures and no vomiting     Past Medical History  Diagnosis Date  . Hypertension   . History of chicken pox     childhood  . History of shingles 2009  . Diabetes mellitus without complication [facility])    Past Surgical History  Procedure Laterality Date  . Tubal ligation    . Hemorrhoid surgery    . Cataract extraction, bilateral     Family History  Problem Relation Age of Onset  . Prostate cancer Neg Hx   . Breast cancer Neg Hx   . Colon cancer Neg Hx   . Heart disease Maternal Grandmother   . Diabetes Neg Hx   . Hypertension Mother   . Hyperlipidemia Neg Hx    Social  History  Substance Use Topics  . Smoking status: Never Smoker   . Smokeless tobacco: Never Used  . Alcohol Use: No   OB History    No data available     Review of Systems  Constitutional: Negative for fever.  HENT: Negative for rhinorrhea and sore throat.   Eyes: Negative for redness.  Respiratory: Negative for cough.   Cardiovascular: Positive for syncope. Negative for chest pain.  Gastrointestinal: Negative for nausea, vomiting, abdominal pain and diarrhea.  Genitourinary: Negative for dysuria.  Musculoskeletal: Negative for myalgias.  Skin: Negative for rash.  Neurological: Positive for light-headedness. Negative for seizures and headaches.    Allergies  Review of patient's allergies indicates no known allergies.  Home Medications   Prior to Admission medications   Medication Sig Start Date End Date Taking? Authorizing Provider  aspirin 325 MG EC tablet Take 325 mg by mouth daily as needed.     Historical Provider, MD  benazepril (LOTENSIN) 10 MG tablet TAKE ONE TABLET BY MOUTH ONCE DAILY 05/02/13   Debbrah Alar, NP  Blood Glucose Monitoring Suppl (FREESTYLE INSULINX SYSTEM) W/DEVICE KIT by Does not apply route every morning.    Historical Provider, MD  glucose blood (FREESTYLE INSULINX TEST) test strip Use to test blood sugar once daily as instructed.  Dx 250.00 06/03/13   Debbrah Alar, NP  hydrochlorothiazide (HYDRODIURIL) 25 MG tablet TAKE ONE TABLET BY MOUTH ONCE DAILY 05/02/13   Debbrah Alar, NP  Lancets (FREESTYLE)  lancets Use to test blood sugar once daily as instructed.  Dx 250.00. 03/08/13   Debbrah Alar, NP  metFORMIN (GLUCOPHAGE-XR) 500 MG 24 hr tablet TAKE ONE TABLET BY MOUTH ONCE DAILY 05/02/13   Debbrah Alar, NP  multivitamin [facility]) per tablet Take 1 tablet by mouth daily.    Historical Provider, MD  Omega-3 Fatty Acids (OMEGA 3 PO) Take 1 capsule by mouth daily.    Historical Provider, MD  simvastatin (ZOCOR) 20 MG tablet Take 1  tablet (20 mg total) by mouth at bedtime. 04/01/13   Debbrah Alar, NP   BP 129/82 mmHg  Pulse 73  Temp(Src) 98.4 F (36.9 C) (Oral)  Resp 18  Ht _0  (1.651 m)  Wt 70.761 kg  BMI 25.96 kg/m2  SpO2 99%   Physical Exam  Constitutional: She is oriented to person, place, and time. She appears well-developed and well-nourished.  HENT:  Head: Normocephalic and atraumatic.  Right Ear: Tympanic membrane, external ear and ear canal normal.  Left Ear: Tympanic membrane, external ear and ear canal normal.  Nose: Nose normal.  Mouth/Throat: Uvula is midline, oropharynx is clear and moist and mucous membranes are normal.  Eyes: Conjunctivae, EOM and lids are normal. Pupils are equal, round, and reactive to light. Right eye exhibits no discharge. Left eye exhibits no discharge. Right eye exhibits no nystagmus. Left eye exhibits no nystagmus.  Neck: Normal range of motion. Neck supple.  Cardiovascular: Normal rate and regular rhythm.   Murmur (systolic) heard. Pulmonary/Chest: Effort normal and breath sounds normal.  Abdominal: Soft. There is no tenderness.  Musculoskeletal:       Cervical back: She exhibits normal range of motion, no tenderness and no bony tenderness.  Neurological: She is alert and oriented to person, place, and time. She has normal strength and normal reflexes. No cranial nerve deficit or sensory deficit. She displays a negative Romberg sign. Coordination and gait normal. GCS eye subscore is 4. GCS verbal subscore is 5. GCS motor subscore is 6.  Skin: Skin is warm and dry.  Psychiatric: She has a normal mood and affect.  Nursing note and vitals reviewed.   ED Course  Procedures (including critical care time) Labs Review Labs Reviewed  BASIC METABOLIC PANEL - Abnormal; Notable for the following:    Glucose, Bld 109 (*)    All other components within normal limits  CBC - Abnormal; Notable for the following:    Hemoglobin 11.4 (*)    HCT 35.4 (*)    All other  components within normal limits  URINALYSIS, ROUTINE W REFLEX MICROSCOPIC (NOT AT [facility]) - Abnormal; Notable for the following:    Specific Gravity, Urine 1.004 (*)    Leukocytes, UA MODERATE (*)    All other components within normal limits  URINE MICROSCOPIC-ADD ON - Abnormal; Notable for the following:    Squamous Epithelial / LPF 0-5 (*)    Bacteria, UA RARE (*)    All other components within normal limits  CBG MONITORING, ED    Imaging Review No results found. I have personally reviewed and evaluated these images and lab results as part of my medical decision-making.   EKG Interpretation None       1:49 PM Patient seen and examined. Work-up initiated. Medications ordered.   Vital signs reviewed and are as follows: BP 129/82 mmHg  Pulse 73  Temp(Src) 98.4 F (36.9 C) (Oral)  Resp 18  Ht _1  (1.651 m)  Wt 70.761 kg  BMI 25.96 kg/m2  SpO2 99%  3:01 PM Patient discussed with and seen by Dr. Jeneen Rinks.   Patient offered admission to the hospital versus discharge to home with strict return instructions and PCP follow-up. Patient wants to go home.  Patient will be discharged. She is instructed to return with any additional lightheadedness, syncope, chest pain, shortness of breath, other symptoms or other concerns. Encouraged PCP follow-up in the next 3 days. Patient verbalizes understanding and agrees with plan.  MDM   Final diagnoses:  Syncope, unspecified syncope type   Patient with syncopal episode with prodrome. She has some cardiac risk factors but has not had any chest pain or shortness of breath. She's never had any heart problems. She has a known murmur. EKG here is normal. Labs are reassuring. Offered admission but does not want to stay. Feel that she can go home with close PCP follow-up and strict return instructions. Patient will be around family today who can monitor closely.    Carlisle Cater, PA-C 07/19/15 [place], MD 08/01/15 [phone]

## 2015-07-19 NOTE — Discharge Instructions (Signed)
Please read and follow all provided instructions.  Your diagnoses today include:  1. Syncope, unspecified syncope type    Tests performed today include:  An EKG of your heart  Cardiac enzymes - a blood test for heart muscle damage  Blood counts and electrolytes  Vital signs. See below for your results today.   Medications prescribed:   None   Take any prescribed medications only as directed.  Follow-up instructions: Please follow-up with your primary care provider as soon as you can for further evaluation of your symptoms.   Return instructions:  SEEK IMMEDIATE MEDICAL ATTENTION IF:  You have severe chest pain, especially if the pain is crushing or pressure-like and spreads to the arms, back, neck, or jaw, or if you have sweating, nausea (feeling sick to your stomach), or shortness of breath. THIS IS AN EMERGENCY. Don't wait to see if the pain will go away. Get medical help at once. Call 911 or 0 (operator). DO NOT drive yourself to the hospital.   Your chest pain gets worse and does not go away with rest.   You have an attack of chest pain lasting longer than usual, despite rest and treatment with the medications your caregiver has prescribed.   You wake from sleep with chest pain or shortness of breath.  You feel dizzy or faint.  You have chest pain not typical of your usual pain for which you originally saw your caregiver.   You have any other emergent concerns regarding your health.  Your vital signs today were: BP 129/82 mmHg   Pulse 73   Temp(Src) 98.4 F (36.9 C) (Oral)   Resp 18   Ht 5\' 5"  (1.651 m)   Wt 70.761 kg   BMI 25.96 kg/m2   SpO2 99% If your blood pressure (BP) was elevated above 135/85 this visit, please have this repeated by your doctor within one month. --------------

## 2017-02-27 ENCOUNTER — Emergency Department (HOSPITAL_BASED_OUTPATIENT_CLINIC_OR_DEPARTMENT_OTHER)
Admission: EM | Admit: 2017-02-27 | Discharge: 2017-02-27 | Disposition: A | Discharge status: Home / Self Care | Payer: MEDICARE | Attending: Emergency Medicine | Admitting: Emergency Medicine

## 2017-02-27 ENCOUNTER — Encounter (HOSPITAL_BASED_OUTPATIENT_CLINIC_OR_DEPARTMENT_OTHER): Payer: Self-pay | Admitting: Emergency Medicine

## 2017-02-27 DIAGNOSIS — T783XXA Angioneurotic edema, initial encounter: Secondary | ICD-10-CM | POA: Insufficient documentation

## 2017-02-27 DIAGNOSIS — Z7984 Long term (current) use of oral hypoglycemic drugs: Secondary | ICD-10-CM | POA: Insufficient documentation

## 2017-02-27 DIAGNOSIS — I10 Essential (primary) hypertension: Secondary | ICD-10-CM | POA: Diagnosis not present

## 2017-02-27 DIAGNOSIS — E119 Type 2 diabetes mellitus without complications: Secondary | ICD-10-CM | POA: Insufficient documentation

## 2017-02-27 DIAGNOSIS — Z79899 Other long term (current) drug therapy: Secondary | ICD-10-CM | POA: Insufficient documentation

## 2017-02-27 DIAGNOSIS — R600 Localized edema: Secondary | ICD-10-CM | POA: Diagnosis present

## 2017-02-27 MED ORDER — PREDNISONE 50 MG PO TABS
60.0000 mg | ORAL_TABLET | Freq: Once | ORAL | Status: AC
  Administered 2017-02-27: 60 mg via ORAL
  Filled 2017-02-27: qty 1

## 2017-02-27 MED ORDER — PREDNISONE 20 MG PO TABS: ORAL_TABLET | ORAL | 0 refills | Status: AC

## 2017-02-27 MED ORDER — DIPHENHYDRAMINE HCL 25 MG PO CAPS
50.0000 mg | ORAL_CAPSULE | Freq: Once | ORAL | Status: AC
  Administered 2017-02-27: 50 mg via ORAL
  Filled 2017-02-27: qty 2

## 2017-02-27 MED ORDER — DIPHENHYDRAMINE HCL 50 MG/ML IJ SOLN: 25.0000 mg | Freq: Once | INTRAMUSCULAR | Status: DC

## 2017-02-27 MED ORDER — FAMOTIDINE 20 MG PO TABS
20.0000 mg | ORAL_TABLET | Freq: Once | ORAL | Status: AC
  Administered 2017-02-27: 20 mg via ORAL
  Filled 2017-02-27: qty 1

## 2017-02-27 MED FILL — predniSONE 20 MG TABS: 20 | 4 days supply | Qty: 8 | Fill #0

## 2017-02-27 NOTE — ED Provider Notes (Signed)
[facility] Provider Note   CSN: [id number] Arrival date & time: 02/27/17  1410     History   Chief Complaint Chief Complaint  Patient presents with  . Facial Swelling    HPI Kelly Griffin is a 71 y.o. female.  71 yo F with a chief complaint of tongue and lip swelling. Started this morning and has improved throughout the day. Denies wheezing denies rash denies vomiting or diarrhea. Denies feeling like she might pass out. Denies throat swelling.   The history is provided by the patient.  Illness  This is a new problem. The current episode started 6 to 12 hours ago. The problem occurs constantly. The problem has been gradually improving. Pertinent negatives include no chest pain, no headaches and no shortness of breath. Nothing aggravates the symptoms. Nothing relieves the symptoms. She has tried nothing for the symptoms. The treatment provided no relief.    Past Medical History:  Diagnosis Date  . Diabetes mellitus without complication ([place])   . History of chicken pox    childhood  . History of shingles 2009  . Hypertension     Patient Active Problem List   Diagnosis Date Noted  . Hair loss 03/30/2013  . Other and unspecified hyperlipidemia 03/30/2013  . Diabetes mellitus type 2, uncomplicated ([place]) 37/35/7897  . HTN (hypertension) 11/01/2011  . Murmur 11/01/2011    Past Surgical History:  Procedure Laterality Date  . CATARACT EXTRACTION, BILATERAL    . HEMORRHOID SURGERY    . TUBAL LIGATION      OB History    No data available       Home Medications    Prior to Admission medications   Medication Sig Start Date End Date Taking? Authorizing Provider  aspirin 325 MG EC tablet Take 325 mg by mouth daily as needed.     [provider]  Blood Glucose Monitoring Suppl (FREESTYLE INSULINX SYSTEM) W/DEVICE KIT by Does not apply route every morning.    [provider]  glucose blood (FREESTYLE INSULINX TEST) test strip Use to test  blood sugar once daily as instructed.  Dx 250.00 06/03/13   Debbrah Alar, NP  hydrochlorothiazide (HYDRODIURIL) 25 MG tablet TAKE ONE TABLET BY MOUTH ONCE DAILY 05/02/13   Debbrah Alar, NP  Lancets (FREESTYLE) lancets Use to test blood sugar once daily as instructed.  Dx 250.00. 03/08/13   Debbrah Alar, NP  metFORMIN (GLUCOPHAGE-XR) 500 MG 24 hr tablet TAKE ONE TABLET BY MOUTH ONCE DAILY 05/02/13   Debbrah Alar, NP  multivitamin [facility]) per tablet Take 1 tablet by mouth daily.    [provider]  Omega-3 Fatty Acids (OMEGA 3 PO) Take 1 capsule by mouth daily.    [provider]  predniSONE (DELTASONE) 20 MG tablet 2 tabs po daily x 4 days 02/27/17   Deno Etienne, DO  simvastatin (ZOCOR) 20 MG tablet Take 1 tablet (20 mg total) by mouth at bedtime. 04/01/13   Debbrah Alar, NP    Family History Family History  Problem Relation Age of Onset  . Heart disease Maternal Grandmother   . Hypertension Mother   . Prostate cancer Neg Hx   . Breast cancer Neg Hx   . Colon cancer Neg Hx   . Diabetes Neg Hx   . Hyperlipidemia Neg Hx     Social History Social History  Substance Use Topics  . Smoking status: Never Smoker  . Smokeless tobacco: Never Used  . Alcohol use No  Allergies   Patient has no known allergies.   Review of Systems Review of Systems  Constitutional: Negative for chills and fever.  HENT: Positive for facial swelling. Negative for congestion and rhinorrhea.   Eyes: Negative for redness and visual disturbance.  Respiratory: Negative for shortness of breath and wheezing.   Cardiovascular: Negative for chest pain and palpitations.  Gastrointestinal: Negative for nausea and vomiting.  Genitourinary: Negative for dysuria and urgency.  Musculoskeletal: Negative for arthralgias and myalgias.  Skin: Negative for pallor and wound.  Neurological: Negative for dizziness and headaches.     Physical Exam Updated Vital Signs BP  (!) 145/88 (BP Location: Right Arm)   Pulse 66   Temp 99.5 F (37.5 C) (Oral)   Resp 14   Ht 5' 5"  (1.651 m)   Wt 69.4 kg (153 lb)   SpO2 100%   BMI 25.46 kg/m   Physical Exam  Constitutional: She is oriented to person, place, and time. She appears well-developed and well-nourished. No distress.  HENT:  Head: Normocephalic and atraumatic.  Swelling to the lower lip, and mild swelling to the tongue. There is no rash. Tolerating secretions without difficulty.  Eyes: EOM are normal. Pupils are equal, round, and reactive to light.  Neck: Normal range of motion. Neck supple.  Cardiovascular: Normal rate and regular rhythm.  Exam reveals no gallop and no friction rub.   No murmur heard. Pulmonary/Chest: Effort normal. She has no wheezes. She has no rales.  Abdominal: Soft. She exhibits no distension. There is no tenderness.  Musculoskeletal: She exhibits no edema or tenderness.  Neurological: She is alert and oriented to person, place, and time.  Skin: Skin is warm and dry. She is not diaphoretic.  Psychiatric: She has a normal mood and affect. Her behavior is normal.  Nursing note and vitals reviewed.    ED Treatments / Results  Labs (all labs ordered are listed, but only abnormal results are displayed) Labs Reviewed - No data to display  EKG  EKG Interpretation None       Radiology No results found.  Procedures Procedures (including critical care time)  Medications Ordered in ED Medications  predniSONE (DELTASONE) tablet 60 mg (60 mg Oral Given 02/27/17 1458)  famotidine (PEPCID) tablet 20 mg (20 mg Oral Given 02/27/17 1458)  diphenhydrAMINE (BENADRYL) capsule 50 mg (50 mg Oral Given 02/27/17 1458)     Initial Impression / Assessment and Plan / ED Course  I have reviewed the triage vital signs and the nursing notes.  Pertinent labs & imaging results that were available during my care of the patient were reviewed by me and considered in my medical decision making (see  chart for details).     71 yo F With a chief complaint of lip and tongue swelling. Started this morning has improved over the course the day. She is on Benzapril. Suspect that this is angioedema. Will start on a course of steroids. Have the patient stop her ACE inhibitor. PCP follow-up.  3:12 PM:  I have discussed the diagnosis/risks/treatment options with the patient and family and believe the pt to be eligible for discharge home to follow-up with PCP. We also discussed returning to the ED immediately if new or worsening sx occur. We discussed the sx which are most concerning (e.g., sudden worsening pain, fever, inability to tolerate by mouth) that necessitate immediate return. Medications administered to the patient during their visit and any new prescriptions provided to the patient are listed below.  Medications given  during this visit Medications  predniSONE (DELTASONE) tablet 60 mg (60 mg Oral Given 02/27/17 1458)  famotidine (PEPCID) tablet 20 mg (20 mg Oral Given 02/27/17 1458)  diphenhydrAMINE (BENADRYL) capsule 50 mg (50 mg Oral Given 02/27/17 1458)     The patient appears reasonably screen and/or stabilized for discharge and I doubt any other medical condition or other [facility] requiring further screening, evaluation, or treatment in the ED at this time prior to discharge.    Final Clinical Impressions(s) / ED Diagnoses   Final diagnoses:  Angioedema, initial encounter    New Prescriptions New Prescriptions   PREDNISONE (DELTASONE) 20 MG TABLET    2 tabs po daily x 4 days     Deno Etienne, DO 02/27/17 1514

## 2017-02-27 NOTE — ED Triage Notes (Signed)
Patient reports facial swelling which began this morning when she woke up.  Swelling noted to bottom lip.  No difficulty speaking at present or maintaining secretions.  Patient reports she feels some swelling to her tongue.

## 2017-02-27 NOTE — ED Notes (Signed)
ED Provider at bedside. 

## 2019-02-04 ENCOUNTER — Other Ambulatory Visit: Payer: Self-pay | Admitting: Family Medicine

## 2019-02-04 DIAGNOSIS — Z1231 Encounter for screening mammogram for malignant neoplasm of breast: Secondary | ICD-10-CM
